# Patient Record
Sex: Female | Born: 2000 | Race: Black or African American | Hispanic: No | Marital: Single | Smoking: Never smoker
Health system: Southern US, Community
[De-identification: ages and names within clinical notes are randomized; demographics above are authoritative.]

## PROBLEM LIST (undated history)

## (undated) DIAGNOSIS — L309 Dermatitis, unspecified: Secondary | ICD-10-CM

---
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
